# Patient Record
Sex: Female | Born: 1980 | Race: Black or African American | Hispanic: No | Marital: Single | Smoking: Never smoker
Health system: Southern US, Community
[De-identification: ages and names within clinical notes are randomized; demographics above are authoritative.]

## PROBLEM LIST (undated history)

## (undated) DIAGNOSIS — I10 Essential (primary) hypertension: Secondary | ICD-10-CM

---

## 2018-10-09 ENCOUNTER — Encounter (HOSPITAL_COMMUNITY): Payer: Self-pay | Admitting: Emergency Medicine

## 2018-10-09 ENCOUNTER — Ambulatory Visit (INDEPENDENT_AMBULATORY_CARE_PROVIDER_SITE_OTHER): Payer: Self-pay

## 2018-10-09 ENCOUNTER — Other Ambulatory Visit: Payer: Self-pay

## 2018-10-09 ENCOUNTER — Ambulatory Visit (HOSPITAL_COMMUNITY)
Admission: EM | Admit: 2018-10-09 | Discharge: 2018-10-09 | Disposition: A | Discharge status: Home / Self Care | Payer: Self-pay

## 2018-10-09 DIAGNOSIS — M545 Low back pain, unspecified: Secondary | ICD-10-CM

## 2018-10-09 DIAGNOSIS — Y9259 Other trade areas as the place of occurrence of the external cause: Secondary | ICD-10-CM

## 2018-10-09 DIAGNOSIS — M25562 Pain in left knee: Secondary | ICD-10-CM

## 2018-10-09 DIAGNOSIS — W07XXXA Fall from chair, initial encounter: Secondary | ICD-10-CM

## 2018-10-09 HISTORY — DX: Essential (primary) hypertension: I10

## 2018-10-09 NOTE — ED Triage Notes (Signed)
Pt states Wednesday night she fell out of a chair at work and landed on her back, states she also felt like she hurt her knee, feels like theres a pulling sensation in her L knee.

## 2018-10-09 NOTE — Discharge Instructions (Addendum)
Take ibuprofen as needed for your discomfort.    Follow-up with an orthopedist as needed if your pain continues or worsens.    Return here or go to the emergency department if you develop acute worsening pain, numbness, tingling, weakness, loss of control of your bowel or bladder, or other concerning symptoms.

## 2018-10-09 NOTE — ED Provider Notes (Signed)
[facility]    CSN: [id number] Arrival date & time: 10/09/18  1813      History   Chief Complaint Chief Complaint  Patient presents with  . Back Pain  . Knee Pain    HPI Stacey Robles is a 38 y.o. female.   Patient presents with lower back pain and left knee pain since falling out of a chair at work on 10/07/2018.  She denies head injury or LOC.  She denies saddle anesthesia, bowel/bladder incontinence,  paresthesias, weakness, or decreased sensation in LE.  LMP: 10/01/2018  The history is provided by the patient.    Past Medical History:  Diagnosis Date  . Hypertension     There are no active problems to display for this patient.   Past Surgical History:  Procedure Laterality Date  . CESAREAN SECTION      OB History   No obstetric history on file.      Home Medications    Prior to Admission medications   Medication Sig Start Date End Date Taking? Authorizing Provider  amLODipine (NORVASC) 10 MG tablet Take by mouth. 10/26/14  Yes [provider]    Family History Family History  Problem Relation Age of Onset  . Cancer Mother   . Hypertension Mother     Social History Social History   Tobacco Use  . Smoking status: Never Smoker  Substance Use Topics  . Alcohol use: Not Currently  . Drug use: Not Currently     Allergies   Penicillins   Review of Systems Review of Systems  Constitutional: Negative for chills and fever.  HENT: Negative for ear pain and sore throat.   Eyes: Negative for pain and visual disturbance.  Respiratory: Negative for cough and shortness of breath.   Cardiovascular: Negative for chest pain and palpitations.  Gastrointestinal: Negative for abdominal pain and vomiting.  Genitourinary: Negative for dysuria and hematuria.  Musculoskeletal: Positive for arthralgias and back pain.  Skin: Negative for color change and rash.  Neurological: Negative for seizures, syncope, weakness and numbness.  All  other systems reviewed and are negative.    Physical Exam Triage Vital Signs ED Triage Vitals  Enc Vitals Group     BP 10/09/18 1826 (!) 155/89     Pulse Rate 10/09/18 1826 73     Resp 10/09/18 1826 17     Temp 10/09/18 1826 97.7 F (36.5 C)     Temp Source 10/09/18 1826 Tympanic     SpO2 10/09/18 1826 100 %     Weight --      Height --      Head Circumference --      Peak Flow --      Pain Score 10/09/18 1831 8     Pain Loc --      Pain Edu? --      Excl. in [place]? --    No data found.  Updated Vital Signs BP (!) 155/89 (BP Location: Left Wrist)   Pulse 73   Temp 97.7 F (36.5 C) (Tympanic)   Resp 17   LMP 10/01/2018   SpO2 100%   Visual Acuity Right Eye Distance:   Left Eye Distance:   Bilateral Distance:    Right Eye Near:   Left Eye Near:    Bilateral Near:     Physical Exam Vitals signs and nursing note reviewed.  Constitutional:      General: She is not in acute distress.    Appearance: She is  well-developed.  HENT:     Head: Normocephalic and atraumatic.  Eyes:     Conjunctiva/sclera: Conjunctivae normal.  Neck:     Musculoskeletal: Neck supple.  Cardiovascular:     Rate and Rhythm: Normal rate and regular rhythm.     Heart sounds: No murmur.  Pulmonary:     Effort: Pulmonary effort is normal. No respiratory distress.     Breath sounds: Normal breath sounds.  Abdominal:     Palpations: Abdomen is soft.     Tenderness: There is no abdominal tenderness.  Musculoskeletal: Normal range of motion.        General: Tenderness present. No swelling or deformity.     Comments: Left knee: Generalized tenderness to palpation.   Lumbar spine and paraspinous muscles tender to palpation.    Skin:    General: Skin is warm and dry.     Capillary Refill: Capillary refill takes less than 2 seconds.     Findings: No bruising, erythema or lesion.  Neurological:     General: No focal deficit present.     Mental Status: She is alert and oriented to person,  place, and time.     Sensory: No sensory deficit.     Motor: No weakness.     Coordination: Coordination normal.     Gait: Gait normal.     Deep Tendon Reflexes: Reflexes normal.      UC Treatments / Results  Labs (all labs ordered are listed, but only abnormal results are displayed) Labs Reviewed - No data to display  EKG   Radiology Dg Lumbar Spine Complete  Result Date: 10/09/2018 CLINICAL DATA:  Low back pain. EXAM: LUMBAR SPINE - COMPLETE 4+ VIEW COMPARISON:  None. FINDINGS: There are calcifications projecting over the lower pole the right kidney measuring up to approximately 1 cm. There are degenerative changes of the right sacroiliac joint. There may be some joint space widening of the bilateral sacroiliac joints. There is some disc height loss throughout the lumbar spine without evidence for an acute displaced fracture or dislocation. IMPRESSION: 1. No acute osseous abnormality. 2. Minimal degenerative changes throughout the lumbar spine. 3. Possible mild widening of the bilateral sacroiliac joints. This can be further evaluated with dedicated radiographs as clinically indicated. 4. Probable right-sided nephrolithiasis as detailed above. Electronically Signed   By: Katherine Mantlehristopher  Green M.D.   On: 10/09/2018 19:36   Dg Knee Complete 4 Views Left  Result Date: 10/09/2018 CLINICAL DATA:  Pain EXAM: LEFT KNEE - COMPLETE 4+ VIEW COMPARISON:  None. FINDINGS: No evidence of fracture, dislocation, or joint effusion. No evidence of arthropathy or other focal bone abnormality. Soft tissues are unremarkable. IMPRESSION: Negative. Electronically Signed   By: Katherine Mantlehristopher  Green M.D.   On: 10/09/2018 19:36    Procedures Procedures (including critical care time)  Medications Ordered in UC Medications - No data to display  Initial Impression / Assessment and Plan / UC Course  I have reviewed the triage vital signs and the nursing notes.  Pertinent labs & imaging results that were available  during my care of the patient were reviewed by me and considered in my medical decision making (see chart for details).   Low back pain and left knee pain following a fall at work.  X-ray of lumbar spine shows no acute osseous abnormality; incidental finding of right-sided nephrolithiasis.  X-ray of left knee negative.  Treating with ibuprofen.  Discussed with patient that she can follow-up with orthopedics if her pain persists or worsens.  Discussed with patient that she should go to the emergency department if she develops acute worsening pain, paresthesias, weakness, saddle anesthesia, bowel or bladder incontinence, or other concerning symptoms.  Discussed with patient incidental finding of nephrolithiasis on her x-ray and instructed her to follow-up with her PCP if she develops symptoms.       Final Clinical Impressions(s) / UC Diagnoses   Final diagnoses:  Acute bilateral low back pain without sciatica  Acute pain of left knee     Discharge Instructions     Take ibuprofen as needed for your discomfort.    Follow-up with an orthopedist as needed if your pain continues or worsens.    Return here or go to the emergency department if you develop acute worsening pain, numbness, tingling, weakness, loss of control of your bowel or bladder, or other concerning symptoms.        ED Prescriptions    None     Controlled Substance Prescriptions [place] Controlled Substance Registry consulted? Yes, I have consulted the [place] Controlled Substances Registry for this patient, and feel the risk/benefit ratio today is favorable for proceeding with this prescription for a controlled substance.   Mickie Bailate, Shanterria Franta H, NP 10/09/18 [phone]

## 2020-07-14 IMAGING — DX LEFT KNEE - COMPLETE 4+ VIEW
5 series · 5 of 5 positions shown · non-contrast
Comparison: None.

CLINICAL DATA: Pain

EXAM:
LEFT KNEE - COMPLETE 4+ VIEW

[knee ap]
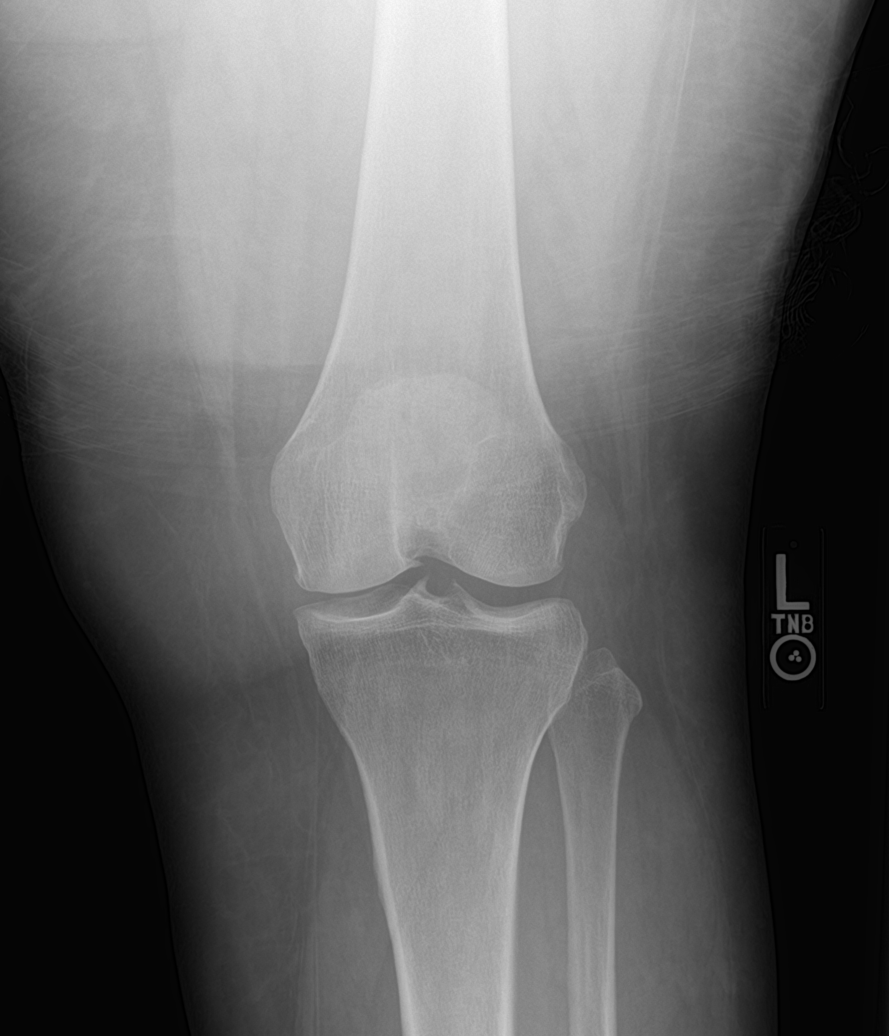

[knee obl (1 of 2)]
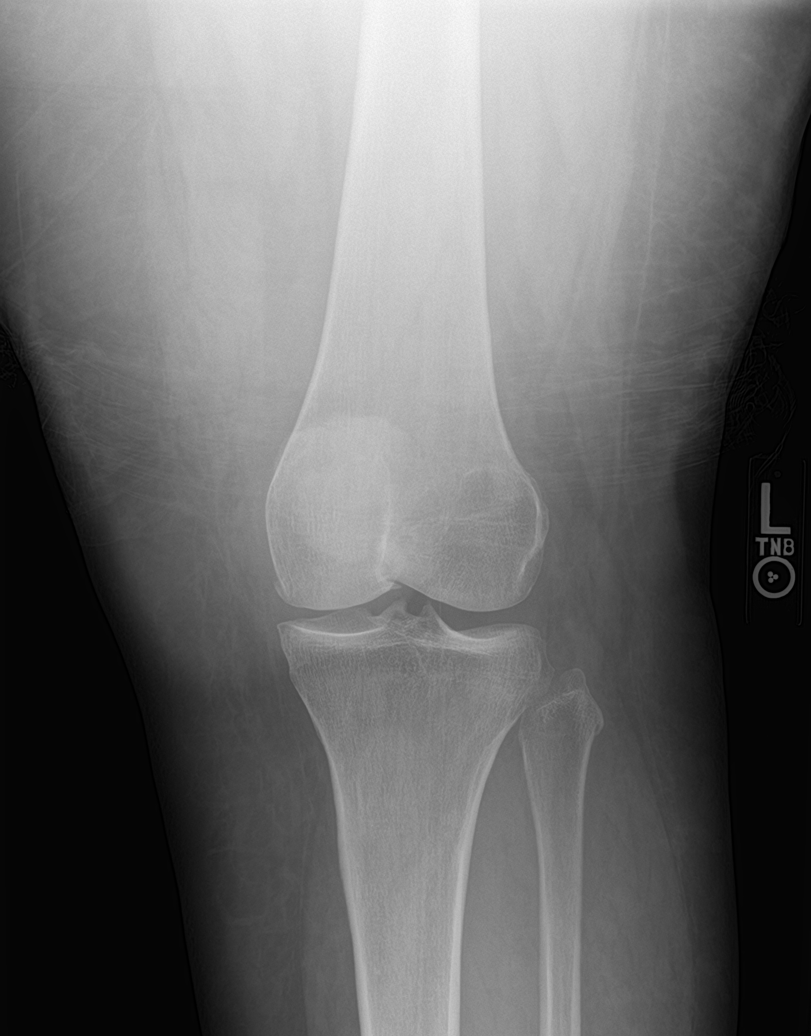

[knee lat]
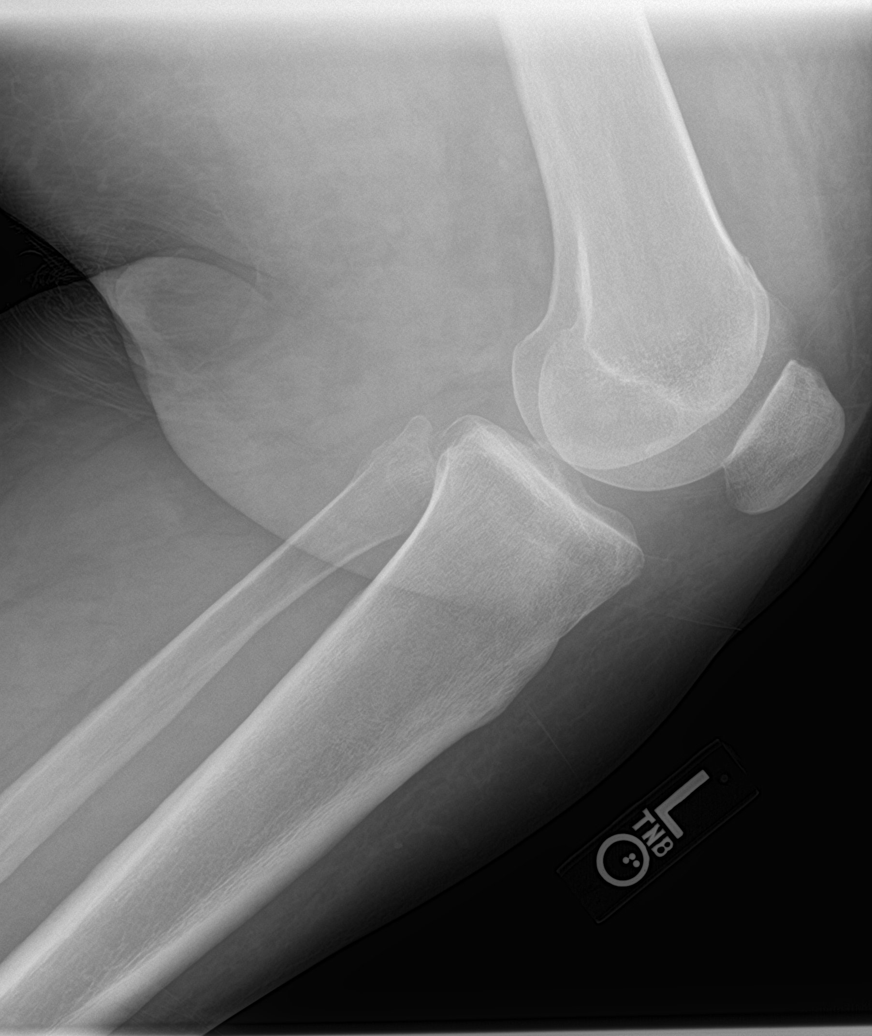

[knee sunrise]
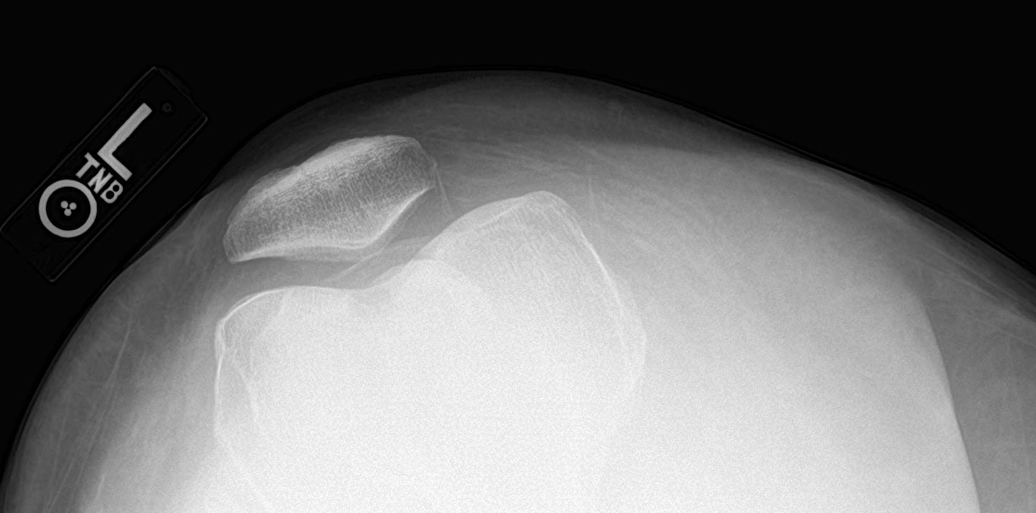

[knee obl (2 of 2)]
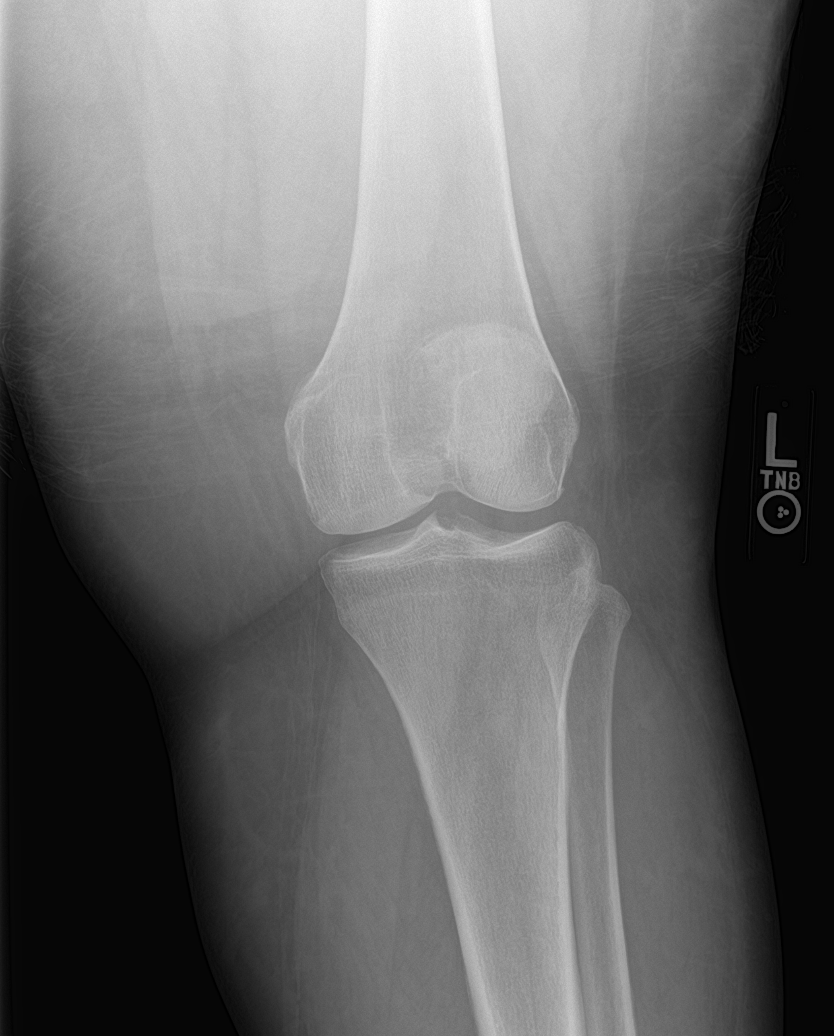

[5 of 5 positions shown; findings below may reference images not displayed]

FINDINGS: No evidence of fracture, dislocation, or joint effusion. No evidence
of arthropathy or other focal bone abnormality. Soft tissues are
unremarkable.
IMPRESSION: Negative.

## 2020-07-14 IMAGING — DX LUMBAR SPINE - COMPLETE 4+ VIEW
5 series · 5 of 5 positions shown · non-contrast
Comparison: None.

CLINICAL DATA: Low back pain.

EXAM:
LUMBAR SPINE - COMPLETE 4+ VIEW

[l-spine obl (1 of 2)]
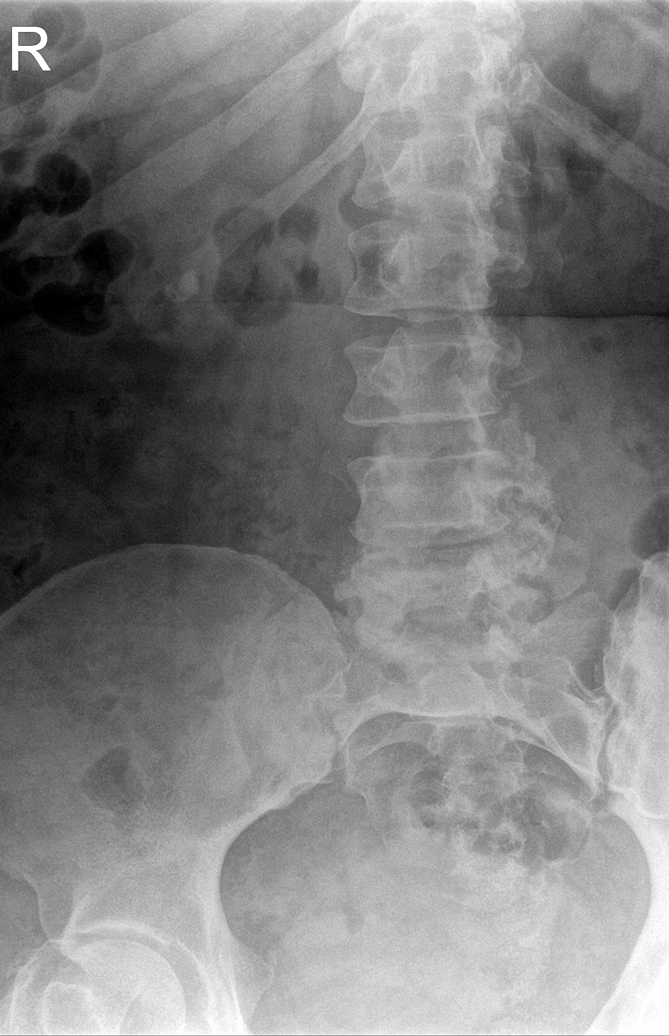

[l-spine obl (2 of 2)]
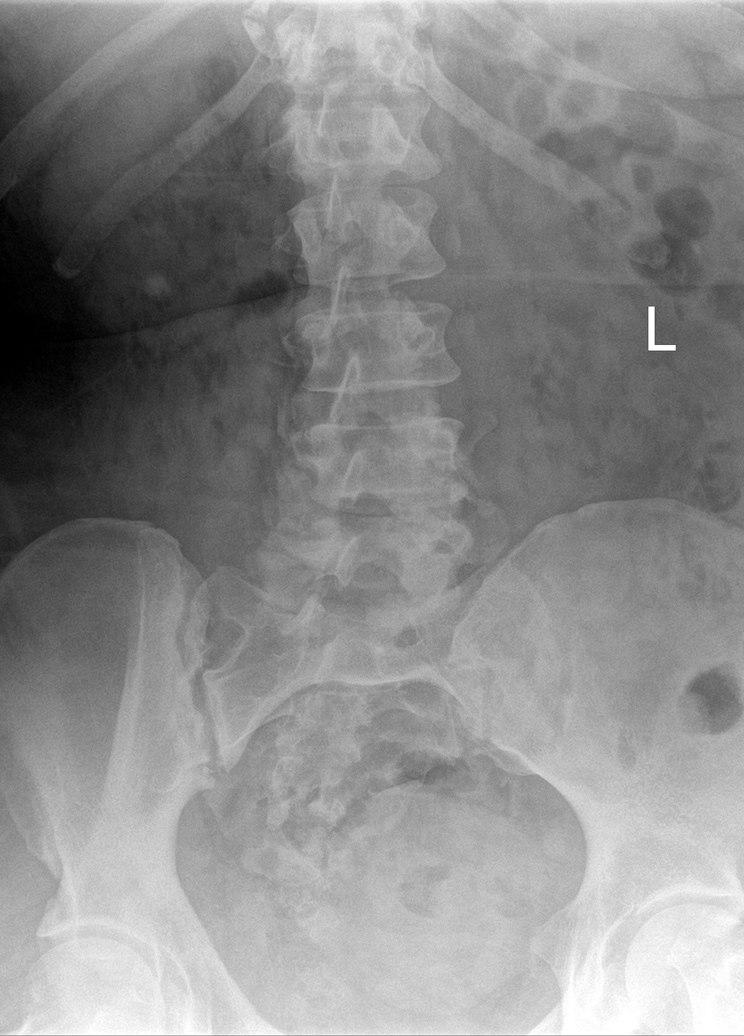

[l-spine lat]
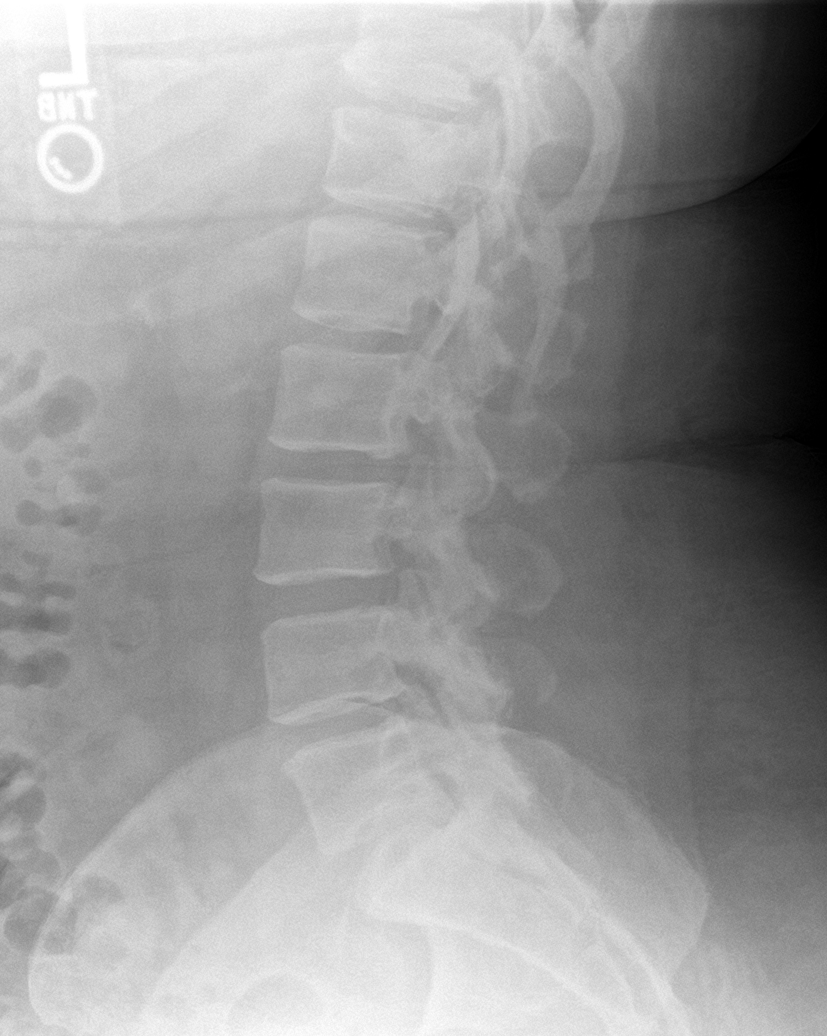

[l-spine spot]
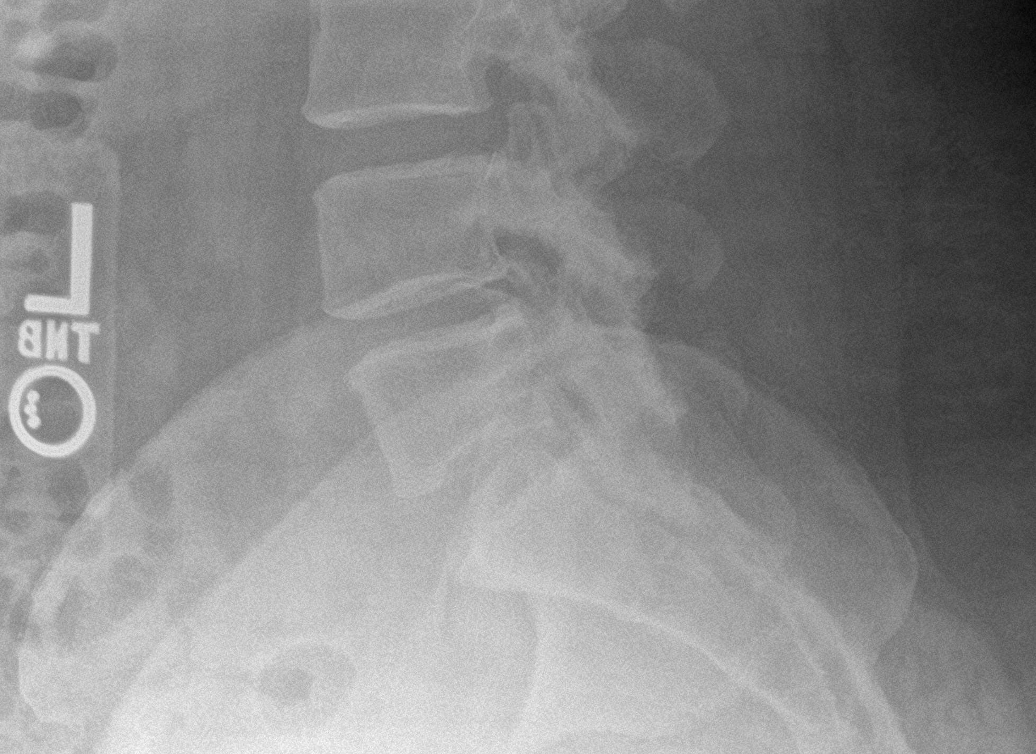

[l-spine ap]
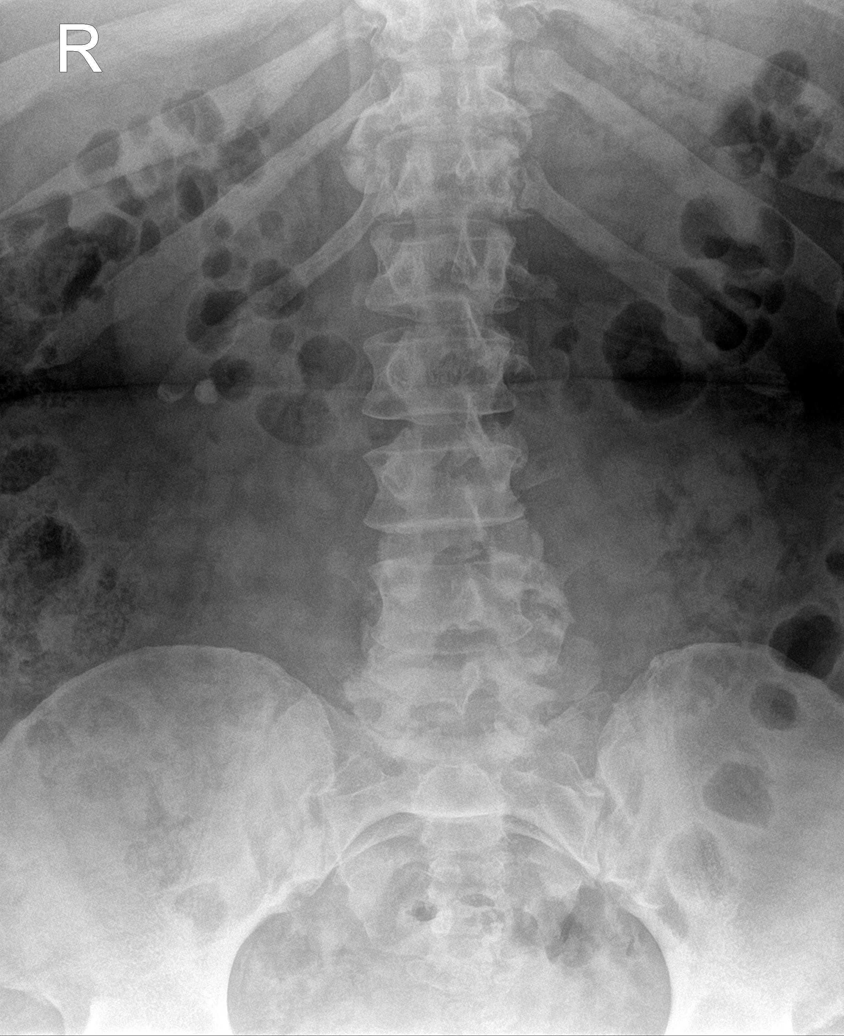

[5 of 5 positions shown; findings below may reference images not displayed]

FINDINGS: There are calcifications projecting over the lower pole the right
kidney measuring up to approximately 1 cm. There are degenerative
changes of the right sacroiliac joint. There may be some joint space
widening of the bilateral sacroiliac joints. There is some disc
height loss throughout the lumbar spine without evidence for an
acute displaced fracture or dislocation.
IMPRESSION: 1. No acute osseous abnormality.
2. Minimal degenerative changes throughout the lumbar spine.
3. Possible mild widening of the bilateral sacroiliac joints. This
can be further evaluated with dedicated radiographs as clinically
indicated.
4. Probable right-sided nephrolithiasis as detailed above.

## 2023-03-08 ENCOUNTER — Ambulatory Visit
Admission: RE | Admit: 2023-03-08 | Discharge: 2023-03-08 | Disposition: A | Discharge status: Home / Self Care | Payer: Medicaid Other | Source: Ambulatory Visit | Attending: Emergency Medicine | Admitting: Emergency Medicine

## 2023-03-08 VITALS — BP 138/89 | HR 91 | Temp 98.6°F | Resp 15

## 2023-03-08 DIAGNOSIS — M62838 Other muscle spasm: Secondary | ICD-10-CM | POA: Insufficient documentation

## 2023-03-08 DIAGNOSIS — R300 Dysuria: Secondary | ICD-10-CM | POA: Insufficient documentation

## 2023-03-08 LAB — POCT URINALYSIS DIP (MANUAL ENTRY)
Bilirubin, UA: NEGATIVE
Glucose, UA: NEGATIVE mg/dL
Ketones, POC UA: NEGATIVE mg/dL
Leukocytes, UA: NEGATIVE
Nitrite, UA: NEGATIVE
Protein Ur, POC: NEGATIVE mg/dL
Spec Grav, UA: 1.025 (ref 1.010–1.025)
Urobilinogen, UA: 0.2 U/dL
pH, UA: 7 (ref 5.0–8.0)

## 2023-03-08 LAB — POCT URINE PREGNANCY: Preg Test, Ur: NEGATIVE

## 2023-03-08 MED ORDER — BACLOFEN 10 MG PO TABS: 10.0000 mg | ORAL_TABLET | Freq: Three times a day (TID) | ORAL | 0 refills | Status: AC

## 2023-03-08 MED ORDER — IBUPROFEN 800 MG PO TABS: 800.0000 mg | ORAL_TABLET | Freq: Three times a day (TID) | ORAL | 0 refills | Status: AC | PRN

## 2023-03-08 MED ORDER — KETOROLAC TROMETHAMINE 30 MG/ML IJ SOLN
30.0000 mg | Freq: Once | INTRAMUSCULAR | Status: AC
  Administered 2023-03-08: 30 mg via INTRAMUSCULAR

## 2023-03-08 NOTE — ED Triage Notes (Signed)
Patient c/o left shoulder/neck pain that began over a week ago.   Home interventions: tylenol, tiger balm   Patient reports having right flank pain, lower abd pain and urinary urgency that began a few days ago.  Home interventions:  none

## 2023-03-08 NOTE — Discharge Instructions (Signed)
The urinalysis that we performed in the clinic today was normal.  Because you have been experiencing symptoms of urinary tract infection, urine culture will be performed.  The result of the urine culture will be available in the next 2 to 3 days and will be posted to your MyChart account.    The results of your vaginal swab test which screens for BV, yeast, gonorrhea, chlamydia and trichomonas will be posted to your MyChart account once it is complete.  This typically takes 2 to 4 days.  Please abstain from sexual intercourse of any kind, vaginal, oral or anal, until you have received the results of your STD testing.      If any of your results are abnormal, you will receive a phone call regarding treatment.  Prescriptions, if any are needed, will be provided for you at your pharmacy.     The mainstay of therapy for musculoskeletal pain is reduction of inflammation and relaxation of tension which is causing inflammation.  Keep in mind, pain always begets more pain.  To help you stay ahead of your pain and inflammation, I have provided the following regimen for you:   During your visit today, you received an injection of ketorolac, high-dose nonsteroidal anti-inflammatory pain medication that should significantly reduce your pain for the next 6 to 8 hours.   When you pick up your prescription from the pharmacy, you can begin taking baclofen 10 mg.  This is a highly effective muscle relaxer and antispasmodic which should continue to provide you with relaxation of your tense muscles, allow you to sleep well and to keep your pain under control.  You can continue taking this medication 3 times daily as you need to.  If you find that this medication makes you too sleepy, you can break them in half for your daytime doses and, if needed double them for your nighttime dose.  Do not take more than 30 mg of baclofen in a 24-hour period.   This evening, please begin taking ibuprofen 800 mg 3 times daily.  Please  keep in mind that it is always easier to treat a little bit of pain that is to treat a lot of pain.  I recommend that for the next several days, you take this medication on a scheduled basis.  After that, take it when you begin to feel the pain returning, do not wait until you are in a lot of pain.   During the day, please set aside time to apply ice to the affected area 4 times daily for 20 minutes each application.  This can be achieved by using a bag of frozen peas or corn, a Ziploc bag filled with ice and water, or Ziploc bag filled with half rubbing alcohol and half Dawn dish detergent, frozen into a slush.  Please be careful not to apply ice directly to your skin, always place a soft cloth between you and the ice pack.  Over-the-counter products such as IcyHot and Biofreeze do not work nearly as well.   Please avoid attempts to stretch or strengthen the affected area until you are feeling completely pain-free.  Attempts to do so will only prolong the healing process.   I also recommend that you remain out of work for the next several days, I provided you with a note to return to work in 3 days.  If you feel that you need this time extended, please follow-up with your primary care provider or return to urgent care for reevaluation  so that we can provide you with a note for another 3 days.   Thank you for visiting [facility] today.  We appreciate the opportunity to participate in your care.

## 2023-03-08 NOTE — ED Provider Notes (Signed)
[facility]    CSN: [id number] Arrival date & time: 03/08/23  1332    HISTORY   Chief Complaint  Patient presents with   Shoulder Pain    Also I'm having flank pain and constant urge to urinate - Entered by patient   Abdominal Pain   Back Pain   Urinary Frequency   HPI Stacey Robles is a pleasant, 43 y.o. female who presents to urgent care today. Patient complains of pain in the muscles between her neck and her left shoulder for the past week.  Patient states is worse when she attempts to cook or do the dishes.  Patient states she has been using Tiger balm and Tylenol without any full relief.  Patient states she is never had this in the past.  Patient states tilting her head to the right also makes the pain worse.  Patient also endorses right lower back pain which she states is not new.  Patient endorses abnormal vaginal discharge, vaginal irritation, increased frequency of urination, and increased urge to urinate.   Patient denies abnormal vaginal odor, suprapubic pain, burning during urination, incontinence of urine, flank pain, fever , body aches, chills, rigors, malaise, and significant fatigue.   The history is provided by the patient.    Past Medical History:  Diagnosis Date   Hypertension    There are no active problems to display for this patient.  Past Surgical History:  Procedure Laterality Date   CESAREAN SECTION     OB History   No obstetric history on file.    Home Medications    Prior to Admission medications   Medication Sig Start Date End Date Taking? Authorizing Provider  amLODipine (NORVASC) 10 MG tablet Take by mouth. 10/26/14   [provider]    Family History Family History  Problem Relation Age of Onset   Cancer Mother    Hypertension Mother    Social History Social History   Tobacco Use   Smoking status: Never  Substance Use Topics   Alcohol use: Not Currently   Drug use: Not Currently   Allergies    Penicillins  Review of Systems Review of Systems Pertinent findings revealed after performing a 14 point review of systems has been noted in the history of present illness.  Physical Exam Vital Signs BP (!) 161/91 (BP Location: Right Arm)   Pulse 91   Temp 98.6 F (37 C) (Oral)   Resp 15   LMP 02/16/2023   SpO2 98%   No data found.  Physical Exam Vitals and nursing note reviewed.  Constitutional:      General: She is not in acute distress.    Appearance: Normal appearance. She is not ill-appearing.  HENT:     Head: Normocephalic and atraumatic.  Eyes:     General: Lids are normal.        Right eye: No discharge.        Left eye: No discharge.     Extraocular Movements: Extraocular movements intact.     Conjunctiva/sclera: Conjunctivae normal.     Right eye: Right conjunctiva is not injected.     Left eye: Left conjunctiva is not injected.     Pupils: Pupils are equal, round, and reactive to light.  Neck:     Trachea: Trachea and phonation normal.  Cardiovascular:     Rate and Rhythm: Normal rate and regular rhythm.     Pulses: Normal pulses.     Heart sounds: Normal heart sounds. No  murmur heard.    No friction rub. No gallop.  Pulmonary:     Effort: Pulmonary effort is normal. No accessory muscle usage, prolonged expiration or respiratory distress.     Breath sounds: Normal breath sounds. No stridor, decreased air movement or transmitted upper airway sounds. No decreased breath sounds, wheezing, rhonchi or rales.  Chest:     Chest wall: No tenderness.  Abdominal:     General: Abdomen is flat. Bowel sounds are normal. There is no distension.     Palpations: Abdomen is soft.     Tenderness: There is no abdominal tenderness. There is no right CVA tenderness or left CVA tenderness.     Hernia: No hernia is present.  Musculoskeletal:        General: Normal range of motion.     Cervical back: Normal range of motion and neck supple. Spasms and tenderness present. No  swelling, edema or bony tenderness. No pain with movement. Normal range of motion.     Lumbar back: Spasms and tenderness present.       Back:  Lymphadenopathy:     Cervical: No cervical adenopathy.  Skin:    General: Skin is warm and dry.     Findings: No erythema or rash.  Neurological:     General: No focal deficit present.     Mental Status: She is alert and oriented to person, place, and time. Mental status is at baseline.  Psychiatric:        Mood and Affect: Mood normal.        Behavior: Behavior normal.        Thought Content: Thought content normal.        Judgment: Judgment normal.     Visual Acuity Right Eye Distance:   Left Eye Distance:   Bilateral Distance:    Right Eye Near:   Left Eye Near:    Bilateral Near:     UC Couse / Diagnostics / Procedures:     Radiology No results found.  Procedures Procedures (including critical care time) EKG  Pending results:  Labs Reviewed  POCT URINALYSIS DIP (MANUAL ENTRY) - Abnormal; Notable for the following components:      Result Value   Blood, UA trace-intact (*)    All other components within normal limits  URINE CULTURE  POCT URINE PREGNANCY  CERVICOVAGINAL ANCILLARY ONLY    Medications Ordered in UC: Medications  ketorolac (TORADOL) 30 MG/ML injection 30 mg (has no administration in time range)    UC Diagnoses / Final Clinical Impressions(s)   I have reviewed the triage vital signs and the nursing notes.  Pertinent labs & imaging results that were available during my care of the patient were reviewed by me and considered in my medical decision making (see chart for details).    Final diagnoses:  Dysuria  Cervical paraspinous muscle spasm   Urinalysis today was normal.due to patient complaining of symptoms of increased urge to urinate and increased frequency of urination, will perform urine culture to confirm.  Patient advised that they will be contacted with results of the urine culture and that  treatment will be provided as indicated based on results. Urine pregnancy test was negative. Vaginal swab obtained to rule out bacterial vaginosis, vaginal Candida as possible cause of GU symptoms, will notify patient of results once received and treat as needed based on results. Patient provided with a ketorolac injection for right lower back pain and pain due to left cervical paraspinous muscle spasm.  Patient provided with a prescription for baclofen to treat spasm and ibuprofen 800 mg every 8 as needed to continue to treat pain as needed.  Conservative care recommended. Return precautions advised.  Drug allergies reviewed, all questions addressed.   Please see discharge instructions below for details of plan of care as provided to patient. ED Prescriptions     Medication Sig Dispense Auth. Provider   baclofen (LIORESAL) 10 MG tablet Take 1 tablet (10 mg total) by mouth 3 (three) times daily for 7 days. 21 tablet Theadora Rama Scales, PA-C   ibuprofen (ADVIL) 800 MG tablet Take 1 tablet (800 mg total) by mouth every 8 (eight) hours as needed for up to 14 days. 42 tablet Theadora Rama Scales, PA-C      PDMP not reviewed this encounter.  Disposition Upon Discharge:  Condition: stable for discharge home  Patient presented with concern for an acute illness with associated systemic symptoms and significant discomfort requiring urgent management. In my opinion, this is a condition that a prudent lay person (someone who possesses an average knowledge of health and medicine) may potentially expect to result in complications if not addressed urgently such as respiratory distress, impairment of bodily function or dysfunction of bodily organs.   As such, the patient has been evaluated and assessed, work-up was performed and treatment was provided in alignment with urgent care protocols and evidence based medicine.  Patient/parent/caregiver has been advised that the patient may require follow up for  further testing and/or treatment if the symptoms continue in spite of treatment, as clinically indicated and appropriate.  Routine symptom specific, illness specific and/or disease specific instructions were discussed with the patient and/or caregiver at length.  Prevention strategies for avoiding STD exposure were also discussed.  The patient will follow up with their current PCP if and as advised. If the patient does not currently have a PCP we will assist them in obtaining one.   The patient may need specialty follow up if the symptoms continue, in spite of conservative treatment and management, for further workup, evaluation, consultation and treatment as clinically indicated and appropriate.  Patient/parent/caregiver verbalized understanding and agreement of plan as discussed.  All questions were addressed during visit.  Please see discharge instructions below for further details of plan.    Discharge Instructions      The urinalysis that we performed in the clinic today was normal.  Because you have been experiencing symptoms of urinary tract infection, urine culture will be performed.  The result of the urine culture will be available in the next 2 to 3 days and will be posted to your MyChart account.    The results of your vaginal swab test which screens for BV, yeast, gonorrhea, chlamydia and trichomonas will be posted to your MyChart account once it is complete.  This typically takes 2 to 4 days.  Please abstain from sexual intercourse of any kind, vaginal, oral or anal, until you have received the results of your STD testing.      If any of your results are abnormal, you will receive a phone call regarding treatment.  Prescriptions, if any are needed, will be provided for you at your pharmacy.     The mainstay of therapy for musculoskeletal pain is reduction of inflammation and relaxation of tension which is causing inflammation.  Keep in mind, pain always begets more pain.  To help  you stay ahead of your pain and inflammation, I have provided the following regimen for you:  During your visit today, you received an injection of ketorolac, high-dose nonsteroidal anti-inflammatory pain medication that should significantly reduce your pain for the next 6 to 8 hours.   When you pick up your prescription from the pharmacy, you can begin taking baclofen 10 mg.  This is a highly effective muscle relaxer and antispasmodic which should continue to provide you with relaxation of your tense muscles, allow you to sleep well and to keep your pain under control.  You can continue taking this medication 3 times daily as you need to.  If you find that this medication makes you too sleepy, you can break them in half for your daytime doses and, if needed double them for your nighttime dose.  Do not take more than 30 mg of baclofen in a 24-hour period.   This evening, please begin taking ibuprofen 800 mg 3 times daily.  Please keep in mind that it is always easier to treat a little bit of pain that is to treat a lot of pain.  I recommend that for the next several days, you take this medication on a scheduled basis.  After that, take it when you begin to feel the pain returning, do not wait until you are in a lot of pain.   During the day, please set aside time to apply ice to the affected area 4 times daily for 20 minutes each application.  This can be achieved by using a bag of frozen peas or corn, a Ziploc bag filled with ice and water, or Ziploc bag filled with half rubbing alcohol and half Dawn dish detergent, frozen into a slush.  Please be careful not to apply ice directly to your skin, always place a soft cloth between you and the ice pack.  Over-the-counter products such as IcyHot and Biofreeze do not work nearly as well.   Please avoid attempts to stretch or strengthen the affected area until you are feeling completely pain-free.  Attempts to do so will only prolong the healing process.   I  also recommend that you remain out of work for the next several days, I provided you with a note to return to work in 3 days.  If you feel that you need this time extended, please follow-up with your primary care provider or return to urgent care for reevaluation so that we can provide you with a note for another 3 days.   Thank you for visiting  Urgent Care today.  We appreciate the opportunity to participate in your care.       This office note has been dictated using Teaching laboratory technician.  Unfortunately, this method of dictation can sometimes lead to typographical or grammatical errors.  I apologize for your inconvenience in advance if this occurs.  Please do not hesitate to reach out to me if clarification is needed.       Theadora Rama Scales, PA-C 03/08/23 1410

## 2023-03-10 LAB — CERVICOVAGINAL ANCILLARY ONLY
Bacterial Vaginitis (gardnerella): NEGATIVE
Candida Glabrata: NEGATIVE
Candida Vaginitis: NEGATIVE
Chlamydia: NEGATIVE
Comment: NEGATIVE
Comment: NEGATIVE
Comment: NEGATIVE
Comment: NEGATIVE
Comment: NEGATIVE
Comment: NORMAL
Neisseria Gonorrhea: NEGATIVE
Trichomonas: NEGATIVE

## 2023-03-12 ENCOUNTER — Encounter: Payer: Self-pay | Admitting: Emergency Medicine

## 2023-04-20 ENCOUNTER — Ambulatory Visit
Admission: RE | Admit: 2023-04-20 | Discharge: 2023-04-20 | Disposition: A | Discharge status: Home / Self Care | Source: Ambulatory Visit

## 2023-04-20 VITALS — BP 164/97 | HR 106 | Temp 100.6°F | Resp 16

## 2023-04-20 DIAGNOSIS — Z20828 Contact with and (suspected) exposure to other viral communicable diseases: Secondary | ICD-10-CM

## 2023-04-20 DIAGNOSIS — J111 Influenza due to unidentified influenza virus with other respiratory manifestations: Secondary | ICD-10-CM | POA: Diagnosis not present

## 2023-04-20 LAB — POC COVID19/FLU A&B COMBO
Covid Antigen, POC: NEGATIVE
Influenza A Antigen, POC: NEGATIVE
Influenza B Antigen, POC: NEGATIVE

## 2023-04-20 MED ORDER — ACETAMINOPHEN 500 MG PO TABS: 1000.0000 mg | ORAL_TABLET | Freq: Three times a day (TID) | ORAL | 0 refills | Status: AC | PRN

## 2023-04-20 MED ORDER — ACETAMINOPHEN 500 MG PO TABS
1000.0000 mg | ORAL_TABLET | Freq: Once | ORAL | Status: AC
  Administered 2023-04-20: 1000 mg via ORAL

## 2023-04-20 MED ORDER — IBUPROFEN 400 MG PO TABS: 400.0000 mg | ORAL_TABLET | Freq: Three times a day (TID) | ORAL | 0 refills | Status: AC | PRN

## 2023-04-20 MED ORDER — GUAIFENESIN 400 MG PO TABS: ORAL_TABLET | ORAL | 0 refills | Status: AC

## 2023-04-20 MED ORDER — PROMETHAZINE-DM 6.25-15 MG/5ML PO SYRP
5.0000 mL | ORAL_SOLUTION | Freq: Every evening | ORAL | 0 refills | Status: AC | PRN
Start: 2023-04-20 — End: ?

## 2023-04-20 MED ORDER — OSELTAMIVIR PHOSPHATE 75 MG PO CAPS: 75.0000 mg | ORAL_CAPSULE | Freq: Two times a day (BID) | ORAL | 0 refills | Status: AC

## 2023-04-20 NOTE — ED Provider Notes (Signed)
 [facility]    CSN: [id number] Arrival date & time: 04/20/23  1339    HISTORY   Chief Complaint  Patient presents with   Headache   Generalized Body Aches   HPI Stacey Robles is a pleasant, 43 y.o. female who presents to urgent care today. Pt c/o 1 day hx body aches, chills, weakness, fatigue, cough, headache, nasal congestion, and abdominal pain. She reports known exposure to influenza A 5 days ago.  Patient denies nausea, vomiting, diarrhea, sore throat.  States cough is nonproductive.  Patient has elevated temperature, blood pressure and heart rate on arrival, patient states she has not taken her blood pressure medicine yet today because she has not felt like eating.  The history is provided by the patient.   Past Medical History:  Diagnosis Date   Hypertension    There are no active problems to display for this patient.  Past Surgical History:  Procedure Laterality Date   CESAREAN SECTION     OB History   No obstetric history on file.    Home Medications    Prior to Admission medications   Medication Sig Start Date End Date Taking? Authorizing Provider  amLODipine (NORVASC) 10 MG tablet Take by mouth. 10/26/14   [provider]  metoprolol succinate (TOPROL-XL) 100 MG 24 hr tablet Take by mouth. 05/03/11   [provider]  pantoprazole (PROTONIX) 40 MG tablet Take by mouth. 09/28/12   [provider]    Family History Family History  Problem Relation Age of Onset   Cancer Mother    Hypertension Mother    Social History Social History   Tobacco Use   Smoking status: Never  Substance Use Topics   Alcohol use: Not Currently   Drug use: Not Currently   Allergies   Penicillins  Review of Systems Review of Systems Pertinent findings revealed after performing a 14 point review of systems has been noted in the history of present illness.  Physical Exam Vital Signs BP (!) 169/112 (BP Location: Right Arm)   Pulse (!)  106   Temp 100.1 F (37.8 C) (Oral)   Resp 16   LMP 04/13/2023 (Exact Date)   SpO2 98%   No data found.  Physical Exam Vitals and nursing note reviewed.  Constitutional:      Appearance: She is morbidly obese. She is ill-appearing.  HENT:     Head: Normocephalic and atraumatic.     Salivary Glands: Right salivary gland is not diffusely enlarged or tender. Left salivary gland is not diffusely enlarged or tender.     Right Ear: Hearing, ear canal and external ear normal. A middle ear effusion is present.     Left Ear: Hearing, ear canal and external ear normal. A middle ear effusion is present.     Nose: Congestion and rhinorrhea present. Rhinorrhea is clear.     Right Sinus: No maxillary sinus tenderness or frontal sinus tenderness.     Left Sinus: No maxillary sinus tenderness.     Mouth/Throat:     Lips: Pink.     Mouth: Mucous membranes are moist.     Pharynx: Uvula midline. Pharyngeal swelling, posterior oropharyngeal erythema and uvula swelling present. No oropharyngeal exudate or postnasal drip.     Tonsils: No tonsillar exudate. 0 on the right. 0 on the left.  Eyes:     General: Lids are normal.     Pupils: Pupils are equal, round, and reactive to light.  Cardiovascular:  Rate and Rhythm: Normal rate and regular rhythm.     Pulses: Normal pulses.  Pulmonary:     Effort: Pulmonary effort is normal. No tachypnea, bradypnea, accessory muscle usage, prolonged expiration, respiratory distress or retractions.     Breath sounds: Normal breath sounds and air entry. No stridor, decreased air movement or transmitted upper airway sounds. No decreased breath sounds, wheezing, rhonchi or rales.  Abdominal:     General: Abdomen is flat. Bowel sounds are normal.     Palpations: Abdomen is soft.  Musculoskeletal:        General: Normal range of motion.     Cervical back: Full passive range of motion without pain, normal range of motion and neck supple.  Lymphadenopathy:      Cervical: Cervical adenopathy present.     Right cervical: Superficial cervical adenopathy present. No deep or posterior cervical adenopathy.    Left cervical: Superficial cervical adenopathy present. No deep or posterior cervical adenopathy.  Skin:    General: Skin is warm and dry.  Neurological:     General: No focal deficit present.     Mental Status: She is alert and oriented to person, place, and time.     Motor: Motor function is intact.     Coordination: Coordination is intact.     Gait: Gait is intact.     Deep Tendon Reflexes: Reflexes are normal and symmetric.  Psychiatric:        Attention and Perception: Attention and perception normal.        Mood and Affect: Mood and affect normal.        Speech: Speech normal.        Behavior: Behavior normal. Behavior is cooperative.        Thought Content: Thought content normal.     Visual Acuity Right Eye Distance:   Left Eye Distance:   Bilateral Distance:    Right Eye Near:   Left Eye Near:    Bilateral Near:     UC Couse / Diagnostics / Procedures:     Radiology No results found.  Procedures Procedures (including critical care time) EKG  Pending results:  Labs Reviewed  POC COVID19/FLU A&B COMBO    Medications Ordered in UC: Medications  acetaminophen (TYLENOL) tablet 1,000 mg (1,000 mg Oral Given 04/20/23 1358)    UC Diagnoses / Final Clinical Impressions(s)   I have reviewed the triage vital signs and the nursing notes.  Pertinent labs & imaging results that were available during my care of the patient were reviewed by me and considered in my medical decision making (see chart for details).    Final diagnoses:  Influenza-like illness  Exposure to influenza   COVID-19, influenza tests were all negative.  Due to patient's known exposure to influenza and influenza-like symptoms, will treat patient empirically for presumed influenza A with a 5-day course of Tamiflu.  Supportive medications discussed and  prescribed.  Conservative care recommended.  Patient counseled to always take her blood pressure medicine at the same time every day.  Return precautions advised.  Please see discharge instructions below for details of plan of care as provided to patient. ED Prescriptions     Medication Sig Dispense Auth. Provider   oseltamivir (TAMIFLU) 75 MG capsule Take 1 capsule (75 mg total) by mouth every 12 (twelve) hours for 5 days. 10 capsule Theadora Rama Scales, PA-C   promethazine-dextromethorphan (PROMETHAZINE-DM) 6.25-15 MG/5ML syrup Take 5 mLs by mouth at bedtime as needed for cough. 60 mL  Theadora Rama Scales, PA-C   guaifenesin (HUMIBID E) 400 MG TABS tablet Take 1 tablet 3 times daily as needed for chest congestion and cough 30 tablet Theadora Rama Scales, PA-C   ibuprofen (ADVIL) 400 MG tablet Take 1 tablet (400 mg total) by mouth every 8 (eight) hours as needed for up to 30 doses. 30 tablet Theadora Rama Scales, PA-C   acetaminophen (TYLENOL) 500 MG tablet Take 2 tablets (1,000 mg total) by mouth every 8 (eight) hours as needed for up to 30 doses for mild pain (pain score 1-3) or fever. 60 tablet Theadora Rama Scales, PA-C      PDMP not reviewed this encounter.  Pending results:  Labs Reviewed  POC COVID19/FLU A&B COMBO      Discharge Instructions      Your rapid influenza antigen test today was negative.  Because you are exposed to influenza and your symptoms as well as my physical exam findings today are concerning that you do have influenza, I recommend that you begin taking Tamiflu now for empiric treatment of presumed influenza.  I have sent a prescription to your pharmacy.  Please take 1 capsule twice daily.    Your COVID-19 test is negative.  Please consider retesting in the next 2 to 3 days, particularly if you are not feeling any better.  You are welcome to return here to urgent care to have it done or you can take a home COVID-19 test.     Conservative care is  recommended with rest, drinking plenty of clear fluids, eating only when hungry, taking supportive medications for your symptoms and avoiding being around other people.  Please remain at home until you are fever free for 24 hours without the use of antifever medications such as Tylenol and ibuprofen.   Please read below to learn more about the medications, dosages and frequencies that I recommend to help alleviate your symptoms and to get you feeling better soon:   Advil, Motrin (ibuprofen): This is a good anti-inflammatory medication which addresses aches, pains and inflammation of the upper airways that causes sinus and nasal congestion as well as in the lower airways which makes your cough feel tight and sometimes burn.  I recommend that you take between 400 to 600 mg every 6-8 hours as needed.  Please do not take more than 2400 mg of ibuprofen in a 24-hour period and please do not take high doses of ibuprofen for more than 3 days in a row as this can lead to stomach ulcers.   Tylenol (acetaminophen): This is a good fever reducer.  If your body temperature rises above 101.5 as measured with a thermometer, it is recommended that you take 1,000 mg every 8 hours until your temperature falls below 101.5.  Please be careful when taking Tylenol (acetaminophen) along with other medications containing acetaminophen or Tylenol.  The maximum dose of Tylenol in a 24-hour period is 3000 mg, taking more than 3000 mg per day can damage your liver.         Robitussin, Mucinex (guaifenesin): This is a daytime expectorant.  This single symptom reliever helps break up chest congestion and loosen up thick nasal drainage making phlegm and drainage easier to cough up and to blow out from your nose.  I recommend taking 400 mg in either liquid or tablet form three times daily as needed.  I do not recommend the 12-hour extended relief version or doses higher than 400 mg per each dose as these often make some  patients feel  jittery or jumpy and can interfere with sleep.  I also do not recommend that you purchase guaifenesin with the ingredient " DM" which is dextromethorphan, a cough suppressant which I only recommend taking at bedtime.  Guaifenesin 400 mg is a safe dose for people who are being treated for high blood pressure.     Promethazine DM: Promethazine is both a nasal decongestant that dries up mucous membranes and an antinausea medication.  Promethazine often makes most patients feel fairly sleepy.  "DM" is dextromethorphan, a single symptom reliever which is a cough suppressant found in many over-the-counter cough medications and combination cold preparations.  Please take 5 mL before bedtime to minimize your cough which will help you sleep better.  I have sent a prescription for this medication to your pharmacy because it cannot be purchased over-the-counter.  *When you don't feel like eating a meal in the morning in preparation for taking your blood pressure medication, please at least try to eat a few crackers or half a piece of toast so that you can always take your blood pressure medicine at the same time every day.*  If symptoms have not meaningfully improved in the next 5 to 7 days, please return for repeat evaluation or follow-up with your regular provider.  If symptoms have worsened in the next 3 to 5 days, please go to the emergency room for further evaluation.    Thank you for visiting urgent care today.  We appreciate the opportunity to participate in your care.       Disposition Upon Discharge:  Condition: stable for discharge home  Patient presented with an acute illness with associated systemic symptoms and significant discomfort requiring urgent management. In my opinion, this is a condition that a prudent lay person (someone who possesses an average knowledge of health and medicine) may potentially expect to result in complications if not addressed urgently such as respiratory distress,  impairment of bodily function or dysfunction of bodily organs.   Routine symptom specific, illness specific and/or disease specific instructions were discussed with the patient and/or caregiver at length.   As such, the patient has been evaluated and assessed, work-up was performed and treatment was provided in alignment with urgent care protocols and evidence based medicine.  Patient/parent/caregiver has been advised that the patient may require follow up for further testing and treatment if the symptoms continue in spite of treatment, as clinically indicated and appropriate.  Patient/parent/caregiver has been advised to return to the [facility] or PCP if no better; to PCP or the Emergency Department if new signs and symptoms develop, or if the current signs or symptoms continue to change or worsen for further workup, evaluation and treatment as clinically indicated and appropriate  The patient will follow up with their current PCP if and as advised. If the patient does not currently have a PCP we will assist them in obtaining one.   The patient may need specialty follow up if the symptoms continue, in spite of conservative treatment and management, for further workup, evaluation, consultation and treatment as clinically indicated and appropriate.  Patient/parent/caregiver verbalized understanding and agreement of plan as discussed.  All questions were addressed during visit.  Please see discharge instructions below for further details of plan.  This office note has been dictated using Teaching laboratory technician.  Unfortunately, this method of dictation can sometimes lead to typographical or grammatical errors.  I apologize for your inconvenience in advance if this occurs.  Please do not  hesitate to reach out to me if clarification is needed.      Theadora Rama Scales, [place] 04/20/23 [phone]

## 2023-04-20 NOTE — Discharge Instructions (Addendum)
 Your rapid influenza antigen test today was negative.  Because you are exposed to influenza and your symptoms as well as my physical exam findings today are concerning that you do have influenza, I recommend that you begin taking Tamiflu now for empiric treatment of presumed influenza.  I have sent a prescription to your pharmacy.  Please take 1 capsule twice daily.    Your COVID-19 test is negative.  Please consider retesting in the next 2 to 3 days, particularly if you are not feeling any better.  You are welcome to return here to urgent care to have it done or you can take a home COVID-19 test.     Conservative care is recommended with rest, drinking plenty of clear fluids, eating only when hungry, taking supportive medications for your symptoms and avoiding being around other people.  Please remain at home until you are fever free for 24 hours without the use of antifever medications such as Tylenol and ibuprofen.   Please read below to learn more about the medications, dosages and frequencies that I recommend to help alleviate your symptoms and to get you feeling better soon:   Advil, Motrin (ibuprofen): This is a good anti-inflammatory medication which addresses aches, pains and inflammation of the upper airways that causes sinus and nasal congestion as well as in the lower airways which makes your cough feel tight and sometimes burn.  I recommend that you take between 400 to 600 mg every 6-8 hours as needed.  Please do not take more than 2400 mg of ibuprofen in a 24-hour period and please do not take high doses of ibuprofen for more than 3 days in a row as this can lead to stomach ulcers.   Tylenol (acetaminophen): This is a good fever reducer.  If your body temperature rises above 101.5 as measured with a thermometer, it is recommended that you take 1,000 mg every 8 hours until your temperature falls below 101.5.  Please be careful when taking Tylenol (acetaminophen) along with other medications  containing acetaminophen or Tylenol.  The maximum dose of Tylenol in a 24-hour period is 3000 mg, taking more than 3000 mg per day can damage your liver.         Robitussin, Mucinex (guaifenesin): This is a daytime expectorant.  This single symptom reliever helps break up chest congestion and loosen up thick nasal drainage making phlegm and drainage easier to cough up and to blow out from your nose.  I recommend taking 400 mg in either liquid or tablet form three times daily as needed.  I do not recommend the 12-hour extended relief version or doses higher than 400 mg per each dose as these often make some patients feel jittery or jumpy and can interfere with sleep.  I also do not recommend that you purchase guaifenesin with the ingredient " DM" which is dextromethorphan, a cough suppressant which I only recommend taking at bedtime.  Guaifenesin 400 mg is a safe dose for people who are being treated for high blood pressure.     Promethazine DM: Promethazine is both a nasal decongestant that dries up mucous membranes and an antinausea medication.  Promethazine often makes most patients feel fairly sleepy.  "DM" is dextromethorphan, a single symptom reliever which is a cough suppressant found in many over-the-counter cough medications and combination cold preparations.  Please take 5 mL before bedtime to minimize your cough which will help you sleep better.  I have sent a prescription for this medication to your  pharmacy because it cannot be purchased over-the-counter.  *When you don't feel like eating a meal in the morning in preparation for taking your blood pressure medication, please at least try to eat a few crackers or half a piece of toast so that you can always take your blood pressure medicine at the same time every day.*  If symptoms have not meaningfully improved in the next 5 to 7 days, please return for repeat evaluation or follow-up with your regular provider.  If symptoms have worsened in the  next 3 to 5 days, please go to the emergency room for further evaluation.    Thank you for visiting urgent care today.  We appreciate the opportunity to participate in your care.

## 2023-04-20 NOTE — ED Triage Notes (Signed)
 Pt c/o body aches, chills, weakness, fatigue, cough, headache, nasal congestion, and abdominal pain. She did have Flu A exposure recently.  Start date: 04/19/2023  Home Interventions: None
